# Patient Record
Sex: Female | Born: 1980 | Race: White | Hispanic: No | Marital: Single | Smoking: Never smoker
Health system: Southern US, Community
[De-identification: ages and names within clinical notes are randomized; demographics above are authoritative.]

## PROBLEM LIST (undated history)

## (undated) DIAGNOSIS — F329 Major depressive disorder, single episode, unspecified: Secondary | ICD-10-CM

## (undated) DIAGNOSIS — R5382 Chronic fatigue, unspecified: Secondary | ICD-10-CM

## (undated) DIAGNOSIS — F32A Depression, unspecified: Secondary | ICD-10-CM

## (undated) DIAGNOSIS — G9332 Myalgic encephalomyelitis/chronic fatigue syndrome: Secondary | ICD-10-CM

## (undated) DIAGNOSIS — G4710 Hypersomnia, unspecified: Principal | ICD-10-CM

## (undated) DIAGNOSIS — F419 Anxiety disorder, unspecified: Secondary | ICD-10-CM

## (undated) HISTORY — DX: Depression, unspecified: F32.A

## (undated) HISTORY — DX: Major depressive disorder, single episode, unspecified: F32.9

## (undated) HISTORY — DX: Hypersomnia, unspecified: G47.10

## (undated) HISTORY — DX: Anxiety disorder, unspecified: F41.9

## (undated) HISTORY — DX: Chronic fatigue, unspecified: R53.82

## (undated) HISTORY — DX: Myalgic encephalomyelitis/chronic fatigue syndrome: G93.32

---
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
## 2011-09-01 ENCOUNTER — Ambulatory Visit (INDEPENDENT_AMBULATORY_CARE_PROVIDER_SITE_OTHER): Payer: BC Managed Care – PPO | Admitting: Emergency Medicine

## 2011-09-01 ENCOUNTER — Encounter: Payer: Self-pay | Admitting: Emergency Medicine

## 2011-09-01 VITALS — BP 103/69 | HR 62 | Temp 97.1°F | Resp 16 | Ht 63.0 in | Wt 119.0 lb

## 2011-09-01 DIAGNOSIS — R5381 Other malaise: Secondary | ICD-10-CM

## 2011-09-01 DIAGNOSIS — G4710 Hypersomnia, unspecified: Secondary | ICD-10-CM

## 2011-09-01 DIAGNOSIS — R5383 Other fatigue: Secondary | ICD-10-CM

## 2011-09-01 DIAGNOSIS — G478 Other sleep disorders: Secondary | ICD-10-CM

## 2011-09-01 DIAGNOSIS — Z0000 Encounter for general adult medical examination without abnormal findings: Secondary | ICD-10-CM

## 2011-09-01 DIAGNOSIS — B373 Candidiasis of vulva and vagina: Secondary | ICD-10-CM

## 2011-09-01 LAB — FERRITIN: Ferritin: 47 ng/mL (ref 10–291)

## 2011-09-01 LAB — COMPREHENSIVE METABOLIC PANEL
AST: 16 U/L (ref 0–37)
Albumin: 4.6 g/dL (ref 3.5–5.2)
BUN: 16 mg/dL (ref 6–23)
CO2: 24 mEq/L (ref 19–32)
Calcium: 9.2 mg/dL (ref 8.4–10.5)
Chloride: 106 mEq/L (ref 96–112)
Creat: 0.71 mg/dL (ref 0.50–1.10)
Glucose, Bld: 80 mg/dL (ref 70–99)
Potassium: 4.1 mEq/L (ref 3.5–5.3)

## 2011-09-01 LAB — T4, FREE: Free T4: 1.41 ng/dL (ref 0.80–1.80)

## 2011-09-01 LAB — CBC WITH DIFFERENTIAL/PLATELET
Eosinophils Relative: 3 % (ref 0–5)
HCT: 40.7 % (ref 36.0–46.0)
Hemoglobin: 14.1 g/dL (ref 12.0–15.0)
Lymphocytes Relative: 34 % (ref 12–46)
Lymphs Abs: 1.5 10*3/uL (ref 0.7–4.0)
MCV: 92.1 fL (ref 78.0–100.0)
Monocytes Absolute: 0.3 10*3/uL (ref 0.1–1.0)
Monocytes Relative: 8 % (ref 3–12)
Neutro Abs: 2.5 10*3/uL (ref 1.7–7.7)
RBC: 4.42 MIL/uL (ref 3.87–5.11)
RDW: 12.6 % (ref 11.5–15.5)
WBC: 4.5 10*3/uL (ref 4.0–10.5)

## 2011-09-01 LAB — HIV ANTIBODY (ROUTINE TESTING W REFLEX): HIV: NONREACTIVE

## 2011-09-01 LAB — IBC PANEL: TIBC: 284 ug/dL (ref 250–470)

## 2011-09-01 LAB — TSH: TSH: 3.55 u[IU]/mL (ref 0.350–4.500)

## 2011-09-01 NOTE — Progress Notes (Signed)
  Subjective:    Patient ID: Kristi Mcdowell, female    DOB: 05-30-1980, 31 y.o.   MRN: [id number]  HPI    Review of Systems     Objective:   Physical Exam        Assessment & Plan:

## 2011-09-01 NOTE — Progress Notes (Signed)
@[facility] @  Patient ID: GENEVEIVE FURNESS MRN: [id number], DOB: 1981/01/17, 31 y.o. Date of Encounter: 09/01/2011, 11:22 AM  Primary Physician: No primary provider on file.  Chief Complaint: Physical (CPE)  HPI: 31 y.o. y/o female with history of noted below here for CPE.  Doing well. No issues/complaints.  LMP:  Pap: MMG: Review of Systems:  Consitutional: No fever, chills, she does complain of daily fatigue  night sweats, lymphadenopathy, or weight changes. Eyes: No visual changes, eye redness, or discharge. ENT/Mouth: Ears: No otalgia, tinnitus, hearing loss, discharge. Nose: No congestion, rhinorrhea, sinus pain, or epistaxis. Throat: No sore throat, post nasal drip, or teeth pain. Cardiovascular: No CP, palpitations, diaphoresis, DOE, edema, orthopnea, PND. Respiratory: She has a history of exercise-induced asthma she has Dulera to take but rarely takes it. As a pro-air inhaler to use as GU she has regular menstrual periods with no problem she does have recurrent yeast infection Gastrointestinal: No anorexia, dysphagia, reflux, pain, nausea, vomiting, hematemesis, diarrhea, constipation, BRBPR, or melena. Breast: No discharge, pain, swelling, or mass. Genitourinary: No dysuria, frequency, urgency, hematuria, incontinence, nocturia, amenorrhea, vaginal discharge, pruritis, burning, abnormal bleeding, or pain. Musculoskeletal: No decreased ROM, myalgias, stiffness, joint swelling, or weakness. Skin: No rash, erythema, lesion changes, pain, warmth, jaundice, or pruritis. Neurological: No headache, dizziness, syncope, seizures, tremors, memory loss, coordination problems, or paresthesias. Psychological: No anxiety, depression, hallucinations, SI/HI. Endocrine: No fatigue, polydipsia, polyphagia, polyuria, or known diabetes. All other systems were reviewed and are otherwise negative.  History reviewed. No pertinent past medical history.   History reviewed. No pertinent past surgical  history.  Home Meds:  Prior to Admission medications   Not on File    Allergies: Not on File  History   Social History  . Marital Status: Unknown    Spouse Name: N/A    Number of Children: N/A  . Years of Education: N/A   Occupational History  . Not on file.   Social History Main Topics  . Smoking status: Never Smoker   . Smokeless tobacco: Never Used  . Alcohol Use: Not on file  . Drug Use: Not on file  . Sexually Active: Not Currently   Other Topics Concern  . Not on file   Social History Narrative  . No narrative on file    Family History  Problem Relation Age of Onset  . Cancer Father     Physical Exam  Blood pressure 103/69, pulse 62, temperature 97.1 F (36.2 C), temperature source Oral, resp. rate 16, height 5\' 3"  (1.6 m), weight 119 lb (53.978 kg), last menstrual period 08/04/2011., Body mass index is 21.08 kg/(m^2). General: Well developed, well nourished, in no acute distress. HEENT: Normocephalic, atraumatic. Conjunctiva pink, sclera non-icteric. Pupils 2 mm constricting to 1 mm, round, regular, and equally reactive to light and accomodation. EOMI. Internal auditory canal clear. TMs with good cone of light and without pathology. Nasal mucosa pink. Nares are without discharge. No sinus tenderness. Oral mucosa pink.   Pharynx without exudate.   Neck: Supple. Trachea midline. No thyromegaly. Full ROM. No lymphadenopathy. Lungs: Clear to auscultation bilaterally without wheezes, rales, or rhonchi. Breathing is of normal effort and unlabored. Cardiovascular: RRR with S1 S2. No murmurs, rubs, or gallops appreciated. Distal pulses 2+ symmetrically. No carotid or abdominal bruits  Breast: Not examined  Abdomen: Soft, non-tender, non-distended with normoactive bowel sounds. No hepatosplenomegaly or masses. No rebound/guarding. No CVA tenderness. Without hernias.  Genitourinary: Not examined  Skin there is a 3 x 4 mm  raised and Nevis left flank area without any  surrounding redness or pigment  Musculoskeletal: Full range of motion and 5/5 strength throughout. Without swelling, atrophy, tenderness, crepitus, or warmth. Extremities without clubbing, cyanosis, or edema. Calves supple. Skin: Warm and moist without erythema, ecchymosis, wounds, or rash. Neuro: A+Ox3. CN II-XII grossly intact. Moves all extremities spontaneously. Full sensation throughout. Normal gait. DTR 2+ throughout upper and lower extremities. Finger to nose intact. Psych:  Responds to questions appropriately with a normal affect.    EKG Normal    Assessment/Plan:  31 y.o. y/o female here for CPE her biggest complaints today are of fatigue and daytime somnolence. She has a very irregular sleep pattern. She seems fatigued all the time and sleeps at least 10-12 hours a day. She states she could go to sleep anytime during the day. The symptoms have been going on for a number of years. She's also concerned that she seems to get ill easily. She also is bothered with recurrent yeast infections she treats with probiotics.   -  Signed, Earl Lites, MD 09/01/2011 11:22 AM

## 2011-09-02 LAB — EPSTEIN-BARR VIRUS VCA ANTIBODY PANEL: EBV VCA IgG: 481 U/mL — ABNORMAL HIGH (ref <18.0)

## 2011-09-02 LAB — IGG, IGA, IGM
IgA: 210 mg/dL (ref 69–380)
IgM, Serum: 127 mg/dL (ref 52–322)

## 2011-09-03 ENCOUNTER — Other Ambulatory Visit: Payer: Self-pay | Admitting: Emergency Medicine

## 2011-09-03 DIAGNOSIS — B2790 Infectious mononucleosis, unspecified without complication: Secondary | ICD-10-CM

## 2011-09-09 ENCOUNTER — Telehealth: Payer: Self-pay

## 2011-09-09 NOTE — Telephone Encounter (Signed)
Pt CB to get lab results. Gave her results and instructions from Dr Cleta Alberts. Pt stated she had gotten a copy of labs and had missed  a call from [facility]. Pt agreed to call them back and set up appt for [place].

## 2011-09-16 ENCOUNTER — Other Ambulatory Visit: Payer: Self-pay

## 2011-09-19 ENCOUNTER — Ambulatory Visit
Admission: RE | Admit: 2011-09-19 | Discharge: 2011-09-19 | Disposition: A | Discharge status: Home / Self Care | Payer: BC Managed Care – PPO | Source: Ambulatory Visit | Attending: Emergency Medicine | Admitting: Emergency Medicine

## 2011-09-19 DIAGNOSIS — B2790 Infectious mononucleosis, unspecified without complication: Secondary | ICD-10-CM

## 2011-09-20 ENCOUNTER — Encounter: Payer: Self-pay | Admitting: Radiology

## 2011-09-20 ENCOUNTER — Other Ambulatory Visit: Payer: Self-pay | Admitting: Emergency Medicine

## 2011-09-20 DIAGNOSIS — R161 Splenomegaly, not elsewhere classified: Secondary | ICD-10-CM

## 2011-09-26 ENCOUNTER — Telehealth: Payer: Self-pay | Admitting: Hematology & Oncology

## 2011-09-26 NOTE — Telephone Encounter (Signed)
Left pt message to call and schedule appointment °

## 2011-09-29 ENCOUNTER — Telehealth: Payer: Self-pay | Admitting: Hematology & Oncology

## 2011-09-29 NOTE — Telephone Encounter (Signed)
Pt aware of 10-13-11 appointment

## 2011-09-29 NOTE — Telephone Encounter (Signed)
Left pt message to call for appointment °

## 2011-10-13 ENCOUNTER — Ambulatory Visit (HOSPITAL_BASED_OUTPATIENT_CLINIC_OR_DEPARTMENT_OTHER): Payer: BC Managed Care – PPO | Admitting: Hematology & Oncology

## 2011-10-13 ENCOUNTER — Ambulatory Visit: Payer: BC Managed Care – PPO

## 2011-10-13 ENCOUNTER — Other Ambulatory Visit (HOSPITAL_COMMUNITY)
Admission: RE | Admit: 2011-10-13 | Discharge: 2011-10-13 | Disposition: A | Discharge status: Home / Self Care | Payer: BC Managed Care – PPO | Source: Ambulatory Visit | Attending: Hematology & Oncology | Admitting: Hematology & Oncology

## 2011-10-13 ENCOUNTER — Other Ambulatory Visit (HOSPITAL_BASED_OUTPATIENT_CLINIC_OR_DEPARTMENT_OTHER): Payer: BC Managed Care – PPO | Admitting: Lab

## 2011-10-13 VITALS — BP 107/71 | HR 80 | Temp 97.1°F | Ht 63.0 in | Wt 120.0 lb

## 2011-10-13 DIAGNOSIS — R161 Splenomegaly, not elsewhere classified: Secondary | ICD-10-CM

## 2011-10-13 LAB — CBC WITH DIFFERENTIAL (CANCER CENTER ONLY)
BASO#: 0 10*3/uL (ref 0.0–0.2)
Eosinophils Absolute: 0.1 10*3/uL (ref 0.0–0.5)
HGB: 14.5 g/dL (ref 11.6–15.9)
LYMPH%: 28.7 % (ref 14.0–48.0)
MCH: 32.2 pg (ref 26.0–34.0)
MCV: 92 fL (ref 81–101)
MONO%: 9.1 % (ref 0.0–13.0)
NEUT#: 3.6 10*3/uL (ref 1.5–6.5)
RBC: 4.51 10*6/uL (ref 3.70–5.32)

## 2011-10-13 LAB — LACTATE DEHYDROGENASE: LDH: 135 U/L (ref 94–250)

## 2011-10-13 LAB — TECHNOLOGIST REVIEW CHCC SATELLITE

## 2011-10-13 NOTE — Progress Notes (Signed)
This office note has been dictated.

## 2011-10-14 NOTE — Progress Notes (Signed)
CC:   Stan Head. Cleta Alberts, M.D.  DIAGNOSES: 1. Splenomegaly. 2. Elevated EBV titers.  HISTORY OF PRESENT ILLNESS:  Ms. Kristi Mcdowell is a very nice, 31 year old, white female.  She is followed by Dr. Earl Lites.  He actually delivered her when she was born.  Ms. Groom has been feeling tired for quite a while.  She just does not have any energy.  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  She has been getting through this with some struggle.  Dr. Cleta Alberts did some lab work back in June.  He found that she had a very high titer Malachi Carl virus.  Her Malachi Carl BCA was 481.  She was negative for IgM EBV. She also had a markedly elevated EBV NA IgG titer over 600.  She has been complaining of recurrent sore throat.  She has not had any hoarseness.  She has had recurrent yeast infections.  She sees a gynecologist who has been helping out with this.  She did have immunoglobulin studies done.  Her IgG IgA and IgM were all normal.  She had iron and HIV which was also nonreactive.  She had iron studies done and thyroid, which all seemed to be within normal limits. Sedimentation rate was only 1.  She had lab work with a CBC done on 06/17.  This showed a white cell count of 4.5, hemoglobin 14.1, hematocrit 40.7, platelet count 187.  She had normal white cell differential.  She had a normal metabolic panel. An ultrasound was done of the abdomen.  This showed some splenomegaly, with a spleen that was slightly prominent at 11.6 cm.  Liver was also "slightly prominent" measuring 17.3 cm.  No lymphadenopathy was appreciated.  Kidneys looked unremarkable.  She has had no fever.  Her weight has been doing okay.  She is trying to exercise but does feel tired.  She does not notice any rashes.  She has had no mouth sores.  There has been no nausea or vomiting.  She has had __________ obviously swollen lymph glands.  She says that her throat gets sore on occasion.  PAST MEDICAL HISTORY:   Unremarkable.  ALLERGIES:  None.  MEDICATIONS:  Multivitamin daily.  SOCIAL HISTORY:  Negative for tobacco use.  There is no significant alcohol use.  No exposure to recreational drugs.  FAMILY HISTORY:  Remarkable for a father with prostate cancer.  He is still alive.  There may be history of hypertension in the family.  REVIEW OF SYSTEMS:  As stated in history of present illness.  No additional findings are noted on a 12-system review.  PHYSICAL EXAMINATION:  General:  This is a well-developed, well- nourished, white female in no obvious distress.  Vital signs:  Show a temperature of 97.1, pulse 80, respiratory rate 18, blood pressure 107/71.  Weight is 120.  Head and neck:  Normocephalic, atraumatic skull.  There are no ocular or oral lesions.  Tonsils are not enlarged. Her neck is supple without any lymphadenopathy in the cervical or supraclavicular regions.  There may be some slight tenderness to palpation over her throat.  Her lungs are clear bilaterally.  Cardiac: Regular rate and rhythm with a normal S1 and S2.  There are no murmurs, rubs or bruits.  Axillary exam shows no bilateral axillary adenopathy. Abdomen:  Soft with good bowel sounds.  There is no palpable abdominal mass.  There is no fluid wave.  There is no guarding or rebound tenderness.  There is no palpable  hepatomegaly.  I really cannot even feel her spleen tip.  I put her on to her right side.  I still cannot feel her spleen tip with inspiration.  Back:  No tenderness over the spine, ribs, or hips.  Extremities:  Shows no clubbing, cyanosis or edema.  She has good range motion of her joints.  There is no joint erythema, warmth or swelling.  Skin:  No rashes, ecchymosis or petechia. Neurologic:  No focal neurological deficits.  LABORATORY STUDIES:  White cell count is 6, hemoglobin 14.5, hematocrit 41.5, platelet count 182.  Peripheral smear shows a normochromic, normocytic population of red blood cells.   There are no nucleated red blood cells.  There are no teardrop cells.  I see no rouleaux formation. There is no spherocytes or schistocyte.  White cells appear normal in morphology and maturation.  There may be a few atypical lymphocytes.  I do not see any immature myeloid or lymphoid cells.  There is no hypersegmented polys.  There are no blasts.  Platelets are adequate in number and size.  IMPRESSION:  Ms. Tabbert is a very nice, 31 year old, white female with elevated EBV titers.  These reflect past infection. I really cannot feel the spleen.  Her ultrasound was done 3 weeks ago. It is possible that the splenomegaly is regressing.  I do not see anything that would suggest an underlying lymphoproliferative issue.  I realize that EBV can be the etiology for Hodgkin disease, Burkitt's lymphoma and nasopharyngeal carcinoma. Again, I do not see any evidence that would suggest any of these issues.  I did send off her peripheral blood for flow cytometry.  I think this would be the ultimate test to let us see if there is any type of monoclonality with lymphocytes.  If so, then it is possible that we may need to further investigate Ms. Fuller with scans or possibly even a bone marrow test.  I would be shocked if Ms. Giancola does have anything that would be considered malignant.  Again, I am not sure what these elevated EBV titers really indicated.  It is possible that she may need to go see an infectious disease specialist for an evaluation.  I felt somewhat comforted knowing that on her exam I did not detect an enlarged liver or spleen.  I cannot detect any palpable lymph nodes in the peripheral lymph node regions.  I had a real nice time with Ms. Broecker.  She is very eloquent.  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  She has a good personality for that type of work.  I really do not think we have to get Ms.  Schuneman back to see [place].  Again, we will let her know about the results from her flow cytometry.  I just do not see any underlying lymphoproliferative problem that Ms. Lien has at this point in time.  I suppose she always may develop something down the road but for now, I just think that this elevated EBV may be related to chronic fatigue syndrome, if that might be an issue. I spent a good hour with Ms. Mellen today.    ______________________________ Josph Macho, M.D. PRE/MEDQ  D:  10/13/2011  T:  10/14/2011  Job:  [id number]

## 2011-10-15 ENCOUNTER — Telehealth: Payer: Self-pay | Admitting: *Deleted

## 2011-10-15 NOTE — Telephone Encounter (Addendum)
Message copied by Mirian Capuchin on Wed Oct 15, 2011  4:39 PM ------      Message from: Josph Macho      Created: Tue Oct 14, 2011  9:09 PM       Call - the very special and specific blood test dose NOT show any evidence of leukemia or lymphoma.  NO cancer cells are seen in the blood!!!! Please send this result to Dr. Earl Lites (her primary MD). Thanks!!  Pete Left message on pt's own cell phone for her to call us about her very good lab results.

## 2011-11-16 ENCOUNTER — Ambulatory Visit (INDEPENDENT_AMBULATORY_CARE_PROVIDER_SITE_OTHER): Payer: BC Managed Care – PPO | Admitting: Emergency Medicine

## 2011-11-16 VITALS — BP 99/64 | HR 76 | Temp 98.5°F | Resp 16 | Ht 63.5 in | Wt 121.0 lb

## 2011-11-16 DIAGNOSIS — A184 Tuberculosis of skin and subcutaneous tissue: Secondary | ICD-10-CM

## 2011-11-16 DIAGNOSIS — Z23 Encounter for immunization: Secondary | ICD-10-CM

## 2011-11-16 DIAGNOSIS — R161 Splenomegaly, not elsewhere classified: Secondary | ICD-10-CM

## 2011-11-16 DIAGNOSIS — R5381 Other malaise: Secondary | ICD-10-CM

## 2011-11-16 NOTE — Progress Notes (Signed)
  Subjective:    Patient ID: Kristi Mcdowell, female    DOB: 03-18-1980, 31 y.o.   MRN: [id number]  HPI patient enters to have form completed for school . She has a history of splenomegaly. She's been to see Dr. Myna Hidalgo. He did not feel she had an underlying blood dyscrasia. 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 She still suffers from fatigue. She has to get extra rest during the day. Tdap was updated today PPD was applied to the right forearm

## 2013-02-12 ENCOUNTER — Telehealth: Payer: Self-pay | Admitting: Emergency Medicine

## 2013-02-12 ENCOUNTER — Other Ambulatory Visit: Payer: Self-pay | Admitting: Emergency Medicine

## 2013-02-12 DIAGNOSIS — F411 Generalized anxiety disorder: Secondary | ICD-10-CM

## 2013-02-12 MED ORDER — ALPRAZOLAM 0.25 MG PO TABS: ORAL_TABLET | ORAL | Status: DC

## 2013-02-12 NOTE — Telephone Encounter (Signed)
I received a phone call from Dr. Darrick Penna. Kristi Mcdowell has been seeing a therapist but recent episodes of anxiety with panic symptoms. We'll treat with alprazolam 0.25 one half to one for episodes. She is going to come to see me in the next few weeks to discuss what is going on and to consider possible SSRI therapy

## 2013-03-12 ENCOUNTER — Telehealth: Payer: Self-pay

## 2013-03-12 NOTE — Telephone Encounter (Signed)
[facility] - PT REQUESTS REFILL ON ALPRAZOLAM.  [phone]

## 2013-03-12 NOTE — Telephone Encounter (Signed)
Called in rx for pt. LMOM of pt that we called it in for her.

## 2013-03-17 ENCOUNTER — Other Ambulatory Visit: Payer: Self-pay | Admitting: Emergency Medicine

## 2013-03-21 ENCOUNTER — Ambulatory Visit (INDEPENDENT_AMBULATORY_CARE_PROVIDER_SITE_OTHER): Payer: BC Managed Care – PPO | Admitting: Emergency Medicine

## 2013-03-21 ENCOUNTER — Encounter: Payer: Self-pay | Admitting: Emergency Medicine

## 2013-03-21 VITALS — BP 106/60 | HR 68 | Temp 98.1°F | Resp 16 | Ht 63.0 in | Wt 121.4 lb

## 2013-03-21 DIAGNOSIS — R5383 Other fatigue: Principal | ICD-10-CM

## 2013-03-21 DIAGNOSIS — R400 Somnolence: Secondary | ICD-10-CM

## 2013-03-21 DIAGNOSIS — R161 Splenomegaly, not elsewhere classified: Secondary | ICD-10-CM

## 2013-03-21 DIAGNOSIS — G4710 Hypersomnia, unspecified: Secondary | ICD-10-CM

## 2013-03-21 DIAGNOSIS — F411 Generalized anxiety disorder: Secondary | ICD-10-CM

## 2013-03-21 DIAGNOSIS — R51 Headache: Secondary | ICD-10-CM

## 2013-03-21 DIAGNOSIS — R5381 Other malaise: Secondary | ICD-10-CM | POA: Insufficient documentation

## 2013-03-21 LAB — COMPLETE METABOLIC PANEL WITH GFR
AST: 17 U/L (ref 0–37)
Albumin: 4.6 g/dL (ref 3.5–5.2)
Alkaline Phosphatase: 39 U/L (ref 39–117)
BUN: 11 mg/dL (ref 6–23)
CALCIUM: 9.3 mg/dL (ref 8.4–10.5)
CHLORIDE: 103 meq/L (ref 96–112)
CO2: 24 meq/L (ref 19–32)
CREATININE: 0.67 mg/dL (ref 0.50–1.10)
GFR, Est Non African American: >89 mL/min
Glucose, Bld: 85 mg/dL (ref 70–99)
Potassium: 4 mEq/L (ref 3.5–5.3)
Sodium: 138 mEq/L (ref 135–145)
Total Bilirubin: 0.9 mg/dL (ref 0.3–1.2)
Total Protein: 7.1 g/dL (ref 6.0–8.3)

## 2013-03-21 LAB — POCT URINALYSIS DIPSTICK
Bilirubin, UA: NEGATIVE
GLUCOSE UA: NEGATIVE
KETONES UA: NEGATIVE
Leukocytes, UA: NEGATIVE
Nitrite, UA: NEGATIVE
Protein, UA: NEGATIVE
Urobilinogen, UA: 0.2
pH, UA: 6

## 2013-03-21 LAB — POCT UA - MICROSCOPIC ONLY
BACTERIA, U MICROSCOPIC: NEGATIVE
CRYSTALS, UR, HPF, POC: NEGATIVE
Casts, Ur, LPF, POC: NEGATIVE
Mucus, UA: NEGATIVE
Yeast, UA: POSITIVE

## 2013-03-21 LAB — CBC WITH DIFFERENTIAL/PLATELET
Basophils Absolute: 0 10*3/uL (ref 0.0–0.1)
Basophils Relative: 0 % (ref 0–1)
Eosinophils Absolute: 0.2 10*3/uL (ref 0.0–0.7)
Eosinophils Relative: 4 % (ref 0–5)
HEMATOCRIT: 40 % (ref 36.0–46.0)
Hemoglobin: 14.2 g/dL (ref 12.0–15.0)
LYMPHS ABS: 1.4 10*3/uL (ref 0.7–4.0)
Lymphocytes Relative: 32 % (ref 12–46)
MCH: 31.9 pg (ref 26.0–34.0)
MCHC: 35.5 g/dL (ref 30.0–36.0)
MCV: 89.9 fL (ref 78.0–100.0)
MONO ABS: 0.4 10*3/uL (ref 0.1–1.0)
Monocytes Relative: 9 % (ref 3–12)
Neutro Abs: 2.3 10*3/uL (ref 1.7–7.7)
Neutrophils Relative %: 55 % (ref 43–77)
Platelets: 185 10*3/uL (ref 150–400)
RBC: 4.45 MIL/uL (ref 3.87–5.11)
RDW: 12.6 % (ref 11.5–15.5)
WBC: 4.2 10*3/uL (ref 4.0–10.5)

## 2013-03-21 LAB — TSH: TSH: 3.046 u[IU]/mL (ref 0.350–4.500)

## 2013-03-21 LAB — SEDIMENTATION RATE: Sed Rate: 1 mm/hr (ref 0–22)

## 2013-03-21 LAB — T4, FREE: FREE T4: 1.32 ng/dL (ref 0.80–1.80)

## 2013-03-21 LAB — C-REACTIVE PROTEIN: CRP: <0.5 mg/dL (ref <0.60)

## 2013-03-21 MED ORDER — ALPRAZOLAM 0.25 MG PO TABS: ORAL_TABLET | ORAL | Status: DC

## 2013-03-21 NOTE — Progress Notes (Signed)
   Subjective:    Patient ID: Kristi Mcdowell, female    DOB: 01/14/1981, 33 y.o.   MRN: [id number]  Headache  This is a chronic problem. The current episode started more than 1 year ago. The problem occurs intermittently. The problem has been unchanged. The pain is located in the bilateral region. The pain quality is similar to prior headaches. The quality of the pain is described as dull. The pain is mild.   Fatigue is persistent and she would sleep all day.she has anxiety and constantly worries about her job.She does see a therapist regularly   Review of Systems  Neurological: Positive for headaches.       Objective:   Physical Exam  Constitutional: She is oriented to person, place, and time. She appears well-developed and well-nourished.  HENT:  Head: Normocephalic and atraumatic.  Right Ear: External ear normal.  Left Ear: External ear normal.  Eyes: Pupils are equal, round, and reactive to light. Right eye exhibits no discharge. Left eye exhibits no discharge. No scleral icterus.  Neck: Normal range of motion. Neck supple. No JVD present. No tracheal deviation present. No thyromegaly present.  Bean sized right post cervical node  Cardiovascular: Normal rate and regular rhythm.   Pulmonary/Chest: Effort normal and breath sounds normal. No respiratory distress. She has no wheezes.  Abdominal: She exhibits no distension. There is no tenderness. There is no rebound and no guarding.  Musculoskeletal: Normal range of motion.  Lymphadenopathy:    She has no cervical adenopathy.  Neurological: She is alert and oriented to person, place, and time. She has normal reflexes. She displays normal reflexes. No cranial nerve deficit. She exhibits normal muscle tone. Coordination normal.  Psychiatric: Her behavior is normal. Judgment and thought content normal.  She does seem depressed          Assessment & Plan:

## 2013-03-21 NOTE — Patient Instructions (Signed)
I repeated your labs today. Appts for  abd. US to check the spleen. Appt Dr Vickey Hugerohmeier for your drowsiness.

## 2013-03-22 ENCOUNTER — Other Ambulatory Visit: Payer: Self-pay | Admitting: *Deleted

## 2013-03-22 DIAGNOSIS — R5381 Other malaise: Secondary | ICD-10-CM

## 2013-03-22 DIAGNOSIS — R5383 Other fatigue: Principal | ICD-10-CM

## 2013-03-22 LAB — EBV AB TO VIRAL CAPSID AG PNL, IGG+IGM: EBV VCA IGG: 607 U/mL — AB (ref <18.0)

## 2013-03-22 NOTE — Addendum Note (Signed)
Addended by: Mervin KungWILLIAMS, RAMONA K on: 03/22/2013 08:58 AM   Modules accepted: Orders

## 2013-03-23 LAB — EPSTEIN-BARR VIRUS NUCLEAR ANTIGEN ANTIBODY, IGG: EBV NA IgG: >600 U/mL — ABNORMAL HIGH (ref <18.0)

## 2013-03-23 LAB — EPSTEIN-BARR VIRUS EARLY D ANTIGEN ANTIBODY, IGG: EBV EA IGG: 11.9 U/mL — AB (ref <9.0)

## 2013-04-04 ENCOUNTER — Other Ambulatory Visit: Payer: BC Managed Care – PPO

## 2013-04-06 ENCOUNTER — Ambulatory Visit
Admission: RE | Admit: 2013-04-06 | Discharge: 2013-04-06 | Disposition: A | Discharge status: Home / Self Care | Payer: BC Managed Care – PPO | Source: Ambulatory Visit | Attending: Emergency Medicine | Admitting: Emergency Medicine

## 2013-04-06 DIAGNOSIS — R161 Splenomegaly, not elsewhere classified: Secondary | ICD-10-CM

## 2013-04-27 ENCOUNTER — Encounter: Payer: Self-pay | Admitting: Neurology

## 2013-04-27 ENCOUNTER — Ambulatory Visit (INDEPENDENT_AMBULATORY_CARE_PROVIDER_SITE_OTHER): Payer: BC Managed Care – PPO | Admitting: Neurology

## 2013-04-27 VITALS — BP 104/72 | HR 74 | Resp 16 | Ht 63.5 in | Wt 121.0 lb

## 2013-04-27 DIAGNOSIS — G47421 Narcolepsy in conditions classified elsewhere with cataplexy: Secondary | ICD-10-CM

## 2013-04-27 DIAGNOSIS — R5382 Chronic fatigue, unspecified: Secondary | ICD-10-CM

## 2013-04-27 DIAGNOSIS — G4710 Hypersomnia, unspecified: Secondary | ICD-10-CM

## 2013-04-27 DIAGNOSIS — G9332 Myalgic encephalomyelitis/chronic fatigue syndrome: Secondary | ICD-10-CM

## 2013-04-27 NOTE — Progress Notes (Signed)
[facility]  Provider:  Larey Seat, [place] D  Referring Provider: Darlyne Russian, MD Primary Care Physician:  Jenny Reichmann, MD  Hypersomnia concern.   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 Everlene Farrier for a sleep evaluation, related to a concern of hypersomnia.   Dear Dr. Everlene Farrier,  Thank you very much for referring your patient Kristi Mcdowell to my care ;   As you know , Kristi Mcdowell is  a 33 year old right-handed, Caucasian female patient, who had been seen recently by you.  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  She has by now been chronically fatigued for over 18 month . The hypersomnia symptom episode begun along  waxing and waning symptoms of malaise and fatigue, the problem has been intermittently occurring but was  Never " gone " .   The patient insists that her co-existent depression did not to correlate with the degree of sleepiness or fatigue she experiences. She has not gained or lost a dramatic amount of weight in the last 3 years at any given time, was not tearful or agitated to a degree that suggests an exacerbation of a mood disorder.    She  reports the following sleep habits;  Kristi Mcdowell attempts to go to bed regularly at about 11 PM, yet it will  take her between 30-60 minutes to fall asleep. She describes her bedroom at cool, quiet and dark and she does not watch TV nor reads when in bed.  Occasionally, she will feel fatigued as early as 8 or 9 PM- she will go earlier to bed and fall asleep promptly, but than  experiences very vivid dreams . She has made a conscious effort to establish a routine bedtime at about 11 PM over the last 2 month. She will now sleep through the night, will not wake up, reports no fragmentation of  sleep. She has to rise  in the morning at about 7:30 and she relies on  multiple alarms at multiple times . She is difficult to arouse, but has been since  childhood  .  She " never" feels that she spontaneously could be ready to get up and go. At this time, she drinks 1-2 cups of coffee in AM , and one in daytime, but none after lunch . Coffee seems to give her the desired little extra kick.  If there is no alarm clock set, she may sleep up to 11 or 12 noon, but rising late, she is not feeling refreshed or restored. She may feel worse and have a headache. She shares the bedroom with her boyfriend.  A dull headache  was noted during the day- increasing as the day goes by.  It doesn't wake her from sleep and doesn't seem to be regularly present as she wakens up in the morning. Her headache is unrelated to any activity and is not exacerbated by activity, is not better when laying down or standing up.She naps whenever there is an opportunity and her naps feel refreshing, even if as brief as  20-30 minutes in duration . She craves napping longer. She has reported frequent  dreams , vivid dreams occurring early in a nap or early onset during night time sleep.  She had experienced sleep  paralysis, dream intrusion and cataplexy.    PS: There is no photophobia, phonophobia, nausea or vomiting related to the HA reported . In addition, the patient reports occasional spells of fevers and elevated temperature, but she has not measured her temperature objectively.  This seems to resolve on its own after several hours,  Sometimes after  has she taken a Tylenol , and she's not sure if Tylenol has any true effect on resolving the symptom of " fever "any quicker.     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  Urine tests performed on 04/27/2013 were negative, CBC performed on 03-21-13 documented  a normal white blood cell count, red blood cell count, hemoglobin and hematocrit, differential blood count did not give any evidence of a acute infection of  bacterial, fungal, allergic or viral factors.  The urine analysis was again in  normal range. Her complete metabolic profile was entirely normal,  as well TSH, T4 - her sedimentation rate is normal , her C-reactive protein is low.  Her repeat viral capsule antigen testing was positive for IgG and IgM, levels  were rising  - reflected in the EBV Epstein-Barr virus VCA Ig units over the last year from 481 to 607.    Ultrasound from 7-5 2013 and the follow up clinic visit in August with Dr. Everlene Farrier revealed that the patient has splenomegaly.    She has no family history of a sleep disorder, works as a Education officer, museum, both parents are physicians , Dr. Aline Brochure and Dr. Oneida Alar.       Review of Systems: Out of a complete 14 system review, the patient complains of only the following symptoms, and all other reviewed systems are negative.  High fatigue level at 50 points. Epworth 11 .  The patient reports falling asleep and going into dream sleep very quickly , often vivid dreams she can recall. She reports that after being startled or emotionally upset she will experience brief periods of muscle tone loss, she has had several episodes of sleep paralysis. She denies having any daytime attacks, she naps when she can , but not having  the irresistible urge to fall asleep.   Her fatigue severity score was endorsed at 50 points and her Epworth sleepiness score is endorsed at 11 points . In her review of systems she endorsed decreased energy and the feeling that she actually sleeps too much  in relation to the sleep time . Sleepiness level  is neither affected by duration of sleep , and described as  not  restorative. She also has endorsed fevers and headaches and some submandibular lymph node swelling.  History   Social History  . Marital Status: Single    Spouse Name: N/A    Number of Children: N/A  . Years of Education: N/A   Occupational History  . Social Work    Social History Main Topics  . Smoking status: Never Smoker   . Smokeless tobacco: Never Used  . Alcohol Use: Yes     Comment: 3 drinks  . Drug Use: No  . Sexual Activity: Yes   Other Topics Concern  . Not on file   Social History Narrative   Significant  other. Exercise: Yes.    Family History  Problem Relation Age of Onset  . Cancer Father   . Heart disease Maternal Grandfather   . Hyperlipidemia Maternal Grandfather   . Hypertension Maternal Grandfather   . Stroke Maternal Grandfather     Past Medical History  Diagnosis Date  . Anxiety   . Depression     No past surgical history on file.  Current Outpatient Prescriptions  Medication Sig Dispense Refill  . ALPRAZolam (XANAX) 0.25 MG tablet Take 1/2-1 tablet as needed for panic episodes  20 tablet  1  . Multiple Vitamin (MULTI-VITAMIN DAILY PO) Take by mouth every morning.      . NON FORMULARY every morning. Herbal mixture tab.      Marland Kitchen OVER THE COUNTER MEDICATION Vitamins D 3, C, B 9      . OVER THE COUNTER MEDICATION Fish oil taking occas       No current facility-administered medications for this visit.    Allergies as of 04/27/2013  . (No Known Allergies)    Vitals: There were no vitals taken for this visit. Last Weight:  Wt Readings from Last 1 Encounters:  03/21/13 121 lb 6.4 oz (55.067 kg)   Last Height:   Ht Readings from Last 1 Encounters:  03/21/13 5' 3"  (1.6 m)    Physical exam: Physical Exam  Head: Normocephalic and atraumatic.  Right Ear: External ear normal.  Left Ear: External ear normal.  Eyes: Pupils are equal, round, and reactive to light. Right eye exhibits no discharge. Left eye exhibits no discharge. No  scleral icterus.  Neck: Normal range of motion. Neck supple. No JVD present. No tracheal deviation present. No thyromegaly present.  Bean sized right posterior cervical lymph node   General: The patient is awake, alert and appears not in acute distress. The patient is well groomed. Head: Normocephalic, atraumatic. Neck is supple. Mallampati 1 , neck circumference: 14  Cardiovascular:  Regular rate and rhythm , without  murmurs or carotid bruit, and without distended neck veins. Respiratory: Lungs are clear to auscultation. Skin:  Without evidence of edema, or rash Trunk: BMI is normal .  The patient has normal posture.  Neurologic exam : The patient is awake and alert, oriented to place and time.  Memory subjective described as intact.  There is a normal attention span & concentration ability. Speech is fluent without  dysarthria, dysphonia or aphasia. Mood and affect are appropriate. The patient was neither depressed nor elated .   Cranial nerves: Pupils are equal and briskly reactive to light. Funduscopic exam without  evidence of pallor or edema.  Extraocular movements  in vertical and horizontal planes intact and without nystagmus. Visual Richman by finger perimetry are intact. Hearing to finger rub intact.  Facial sensation intact to fine touch. Facial motor strength is symmetric and tongue and uvula move midline.  Motor exam:   Normal tone, muscle bulk and symmetric strength in all extremities.  Sensory:  Fine touch, pinprick and vibration were tested in all extremities. Proprioception was normal.  Coordination: Rapid alternating movements in the fingers/hands is normal. Finger-to-nose maneuvernormal without evidence of ataxia, dysmetria or tremor.  Gait and station: Patient walks without assistive device , un- assisted able to  climb up to the exam table.  Strength within normal limits. Stance is stable and normal. Tandem gait is intact  , turns with 3 Steps are un fragmented.  Romberg testing is normal.  Deep tendon reflexes: in the  upper and  lower extremities are symmetric and intact. Babinski maneuver response is  down going.   Assessment:  After physical and neurologic examination, review of laboratory studies, imaging, neurophysiology testing and pre-existing records, assessment is :  #1 the patient's appears to be chronically fatigued. Pulse she has a history of depression but  feels that changes in her mood have not correlated with changes in her sleep pattern.  She is chronically hypersomnic with prolonged sleep time . This has been progressive  over the last 15 months and seems to correlate with an increase in Epstein barr virus capsule antigen.  She still has to work on her sleep hygiene and establish better sleep routine, but she has been well on the way to at least establish a bedtime and a rise time.  She is also asked to further  restricted her caffeine intake.  Her report of sleep of  Power naps feeling refreshing and of dreams that will occur even during  short naps causes me to evlaluate her of narcolepsy.   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   I suggested to obtain a Hypocretin/ Orexin CSF level , which will be reflecting an acquired form of narcolepsy.  In some cases ,  narcolepsy can manifest as chronic fatigue without necessarily having the hallmark sleep attacks being present.  As noted above, the patient has hypersomnia with extended, prolonged sleep times.  There is also an HLA test we can order - after discussion with the patient, she favored this approach over the procedure of the lumbal puncture.   Plan:  Treatment plan and additional workup :  PSG and MSLT.  HLA test for genetic disposition to narcolepsy  . If negative :  CSF orexin / hypocretin.    Payeton Germani, MD

## 2013-04-27 NOTE — Patient Instructions (Signed)
Hypersomnia Hypersomnia usually brings recurrent episodes of excessive daytime sleepiness or prolonged nighttime sleep. It is different than feeling tired due to lack of or interrupted sleep at night. People with hypersomnia are compelled to nap repeatedly during the day. This is often at inappropriate times such as:  At work.  During a meal.  In conversation. These daytime naps usually provide no relief. This disorder typically affects adolescents and young adults. CAUSES  This condition may be caused by:  Another sleep disorder (such as narcolepsy or sleep apnea).  Dysfunction of the autonomic nervous system.  Drug or alcohol abuse.  A physical problem, such as:  A tumor.  Head trauma. This is damage caused by an accident.  Injury to the central nervous system.  Certain medications, or medicine withdrawal.  Medical conditions may contribute to the disorder, including:  Multiple sclerosis.  Depression.  Encephalitis.  Epilepsy.  Obesity.  Some people appear to have a genetic predisposition to this disorder. In others, there is no known cause. SYMPTOMS   Patients often have difficulty waking from a long sleep. They may feel dazed or confused.  Other symptoms may include:  Anxiety.  Increased irritation (inflammation).  Decreased energy.  Restlessness.  Slow thinking.  Slow speech.  Loss of appetite.  Hallucinations.  Memory difficulty.  Tremors, Tics.  Some patients lose the ability to function in family, social, occupational, or other settings. TREATMENT  Treatment is symptomatic in nature. Stimulants and other drugs may be used to treat this disorder. Changes in behavior may help. For example, avoid night work and social activities that delay bed time. Changes in diet may offer some relief. Patients should avoid alcohol and caffeine. PROGNOSIS  The likely outcome (prognosis) for persons with hypersomnia depends on the cause of the disorder.  The disorder itself is not life threatening. But it can have serious consequences. For example, automobile accidents can be caused by falling asleep while driving. The attacks usually continue indefinitely. Document Released: 02/21/2002 Document Revised: 05/26/2011 Document Reviewed: 01/26/2008 [employer] Patient Information 2014 [employer].  

## 2013-05-05 ENCOUNTER — Encounter: Payer: Self-pay | Admitting: Neurology

## 2013-05-05 DIAGNOSIS — G9332 Myalgic encephalomyelitis/chronic fatigue syndrome: Secondary | ICD-10-CM

## 2013-05-05 DIAGNOSIS — R5382 Chronic fatigue, unspecified: Secondary | ICD-10-CM

## 2013-05-05 DIAGNOSIS — G4710 Hypersomnia, unspecified: Secondary | ICD-10-CM

## 2013-05-05 HISTORY — DX: Chronic fatigue, unspecified: R53.82

## 2013-05-05 HISTORY — DX: Hypersomnia, unspecified: G47.10

## 2013-05-05 HISTORY — DX: Myalgic encephalomyelitis/chronic fatigue syndrome: G93.32

## 2013-05-06 ENCOUNTER — Ambulatory Visit (INDEPENDENT_AMBULATORY_CARE_PROVIDER_SITE_OTHER): Payer: BC Managed Care – PPO | Admitting: Emergency Medicine

## 2013-05-06 ENCOUNTER — Encounter: Payer: Self-pay | Admitting: Emergency Medicine

## 2013-05-06 VITALS — BP 108/52 | HR 120 | Temp 98.3°F | Resp 16 | Wt 119.0 lb

## 2013-05-06 DIAGNOSIS — R5383 Other fatigue: Secondary | ICD-10-CM

## 2013-05-06 DIAGNOSIS — R161 Splenomegaly, not elsewhere classified: Secondary | ICD-10-CM

## 2013-05-06 DIAGNOSIS — G4700 Insomnia, unspecified: Secondary | ICD-10-CM

## 2013-05-06 DIAGNOSIS — B2790 Infectious mononucleosis, unspecified without complication: Secondary | ICD-10-CM

## 2013-05-06 DIAGNOSIS — R5381 Other malaise: Secondary | ICD-10-CM

## 2013-05-06 LAB — CBC WITH DIFFERENTIAL/PLATELET
BASOS ABS: 0 10*3/uL (ref 0.0–0.1)
Basophils Relative: 0 % (ref 0–1)
EOS ABS: 0.1 10*3/uL (ref 0.0–0.7)
Eosinophils Relative: 1 % (ref 0–5)
HCT: 44 % (ref 36.0–46.0)
HEMOGLOBIN: 15.5 g/dL — AB (ref 12.0–15.0)
Lymphocytes Relative: 31 % (ref 12–46)
Lymphs Abs: 1.6 10*3/uL (ref 0.7–4.0)
MCH: 31.8 pg (ref 26.0–34.0)
MCHC: 35.2 g/dL (ref 30.0–36.0)
MCV: 90.3 fL (ref 78.0–100.0)
MONO ABS: 0.3 10*3/uL (ref 0.1–1.0)
MONOS PCT: 6 % (ref 3–12)
NEUTROS PCT: 62 % (ref 43–77)
Neutro Abs: 3.2 10*3/uL (ref 1.7–7.7)
Platelets: 200 10*3/uL (ref 150–400)
RBC: 4.87 MIL/uL (ref 3.87–5.11)
RDW: 12.8 % (ref 11.5–15.5)
WBC: 5.1 10*3/uL (ref 4.0–10.5)

## 2013-05-06 NOTE — Progress Notes (Signed)
I agree with the assessment and plan as directed by NP .The patient is known to me .   Rendy Lazard, MD  

## 2013-05-06 NOTE — Progress Notes (Addendum)
   Subjective:    Patient ID: Kristi Mcdowell, female    DOB: 08/12/1980, 33 y.o.   MRN: [id number]  HPI  Visits with Dr. Vickey Hugerohmeier  are going well..  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  Doctor said that it could be virally induced.  Sore throat and getting headaches every other day.  Slept all week.  Forces herself to go to work.  Usually gets a burst of energy in the afternoon.  Thinks the problem has been going on for years.  Thinks it started in her early 20's but getting worse now.  Would like to see someone who specializes in chronic fatigue.  Likes to sleep and dreams the entire time and never feels rested. She states she feels like she has a chronic sore throat and her glands swell off and on. She has rising titers of EBV along with symptoms consistent with narcolepsy .      Review of Systems     Objective:   Physical Exam Patient is alert and cooperative she is not in any distress. Her neck is supple. Her chest is clear. Heart is regular rate without murmurs rubs or gallops. Abdomen soft liver spleen not enlarged there are no areas of tenderness. The spleen could not be palpated. There is no adenopathy in the neck axillary areas or inguinal areas       Assessment & Plan:   patient is proceeding with narcolepsy evaluation with Dr. Vickey Hugerohmeier. She does have some concerns about the mention of MS. I will support a copy of these notes to Dr. Myna HidalgoEnnever and also to Dr. Vickey Hugerohmeier. I did repeat her viral titers today and added Lyme titers and titers for  trypanosomiasis because [place] . Referral also made to Dr.  Jacinto HalimGanji for  echo to be sure she does not have any type of cardiomyopathy

## 2013-05-07 LAB — TOXOPLASMA ANTIBODIES- IGG AND  IGM: Toxoplasma IgG Ratio: <3 IU/mL (ref <7.2)

## 2013-05-07 LAB — EPSTEIN-BARR VIRUS VCA ANTIBODY PANEL
EBV EA IgG: 13.3 U/mL — ABNORMAL HIGH (ref <9.0)
EBV NA IgG: >600 U/mL — ABNORMAL HIGH (ref <18.0)
EBV VCA IGG: 522 U/mL — AB (ref <18.0)
EBV VCA IgM: <10 U/mL (ref <36.0)

## 2013-05-07 LAB — CYTOMEGALOVIRUS ANTIBODY, IGG: Cytomegalovirus Ab-IgG: >10 U/mL — ABNORMAL HIGH (ref <0.60)

## 2013-05-07 NOTE — Addendum Note (Signed)
Addended by: Lesle ChrisAUB, Zamari Vea A on: 05/07/2013 07:37 AM   Modules accepted: Orders

## 2013-05-09 LAB — B. BURGDORFI ANTIBODIES: B BURGDORFERI AB IGG+ IGM: 0.3 {ISR}

## 2013-05-10 ENCOUNTER — Telehealth: Payer: Self-pay | Admitting: Neurology

## 2013-05-10 NOTE — Progress Notes (Signed)
Quick Note:  Left message that lab showed elevated EBvirus and CMV antibodies, per Dr. Brett Fairy. Patient is call and come in for HLA typing blood draw. ______

## 2013-05-10 NOTE — Telephone Encounter (Signed)
Left message that the blood test for HLA typing was negative.  Told to call with any questions.

## 2013-05-13 LAB — TRYPANOSOMA CRUZI ANTIBODY, TOTAL

## 2013-05-14 ENCOUNTER — Other Ambulatory Visit: Payer: Self-pay | Admitting: Emergency Medicine

## 2013-05-14 DIAGNOSIS — B259 Cytomegaloviral disease, unspecified: Secondary | ICD-10-CM

## 2013-05-14 DIAGNOSIS — B2790 Infectious mononucleosis, unspecified without complication: Secondary | ICD-10-CM

## 2013-05-24 ENCOUNTER — Ambulatory Visit (INDEPENDENT_AMBULATORY_CARE_PROVIDER_SITE_OTHER): Payer: BC Managed Care – PPO | Admitting: Neurology

## 2013-05-24 DIAGNOSIS — G47421 Narcolepsy in conditions classified elsewhere with cataplexy: Secondary | ICD-10-CM

## 2013-05-24 DIAGNOSIS — G4710 Hypersomnia, unspecified: Secondary | ICD-10-CM

## 2013-05-25 ENCOUNTER — Ambulatory Visit: Payer: BC Managed Care – PPO | Admitting: Infectious Disease

## 2013-05-25 DIAGNOSIS — G4710 Hypersomnia, unspecified: Secondary | ICD-10-CM

## 2013-05-25 DIAGNOSIS — G47421 Narcolepsy in conditions classified elsewhere with cataplexy: Secondary | ICD-10-CM

## 2013-06-07 NOTE — Sleep Study (Signed)
See media tab for full report  

## 2013-06-07 NOTE — Sleep Study (Signed)
MSLT Nap Questionnaire  PRE NAP SLEEPINESS SCALE 1-7  1 - Feeling Active and vital, alert, wide awake  2 - Functioning at a high level, but not at peak, able to concentrate  3 - Relaxed and awake, not at full alertness, responsive  4- A little foggy, let down  5 - Fogginess, beginning to lose interest in remaining awake  6 - Sleepiness, prefer to be lying down, fighting sleep, woozy  7 - Almost in reverie, sleep onset soon, losing struggle to remain awake  =============================================================== Nap #1 Pre nap level of sleepiness:  6 Lights out time:   Comments:  Post Nap Questions for nap #1:   Did you sleep?  YES  Did you dream?  YES  ================================================================ Nap #2 Pre nap level of sleepiness:  5 Lights out time:  Comments:  Post Nap Questions for nap #2:  Did you sleep? I THINK SO  Did you dream? I THINK SO ================================================================ Nap #3 Pre nap level of sleepiness:  4 Lights out time:  Comments:  Post Nap Questions for Nap #3:  Did you sleep?  YES  Did you dream?  YES  =============================================================== Nap #4 Pre nap level of sleepiness:  4 Lights out time:  Comments:  PATIENT WAS DOZING BETWEEN NAPS 3 AND 4, HAD TO BE ENCOURAGED TO GET UP AND OUT OF BED.  Post Nap Questions for Nap #4:  Did you sleep?  I DON'T THINK SO.  Did you dream?  NO.  =============================================================== Nap #5  - DEFERRED DUE TO TIME CONSTRAINTS, LAST NAP WOULDN'T START UNTIL 5:10 PM, POSSIBLE END TIME OF NEARLY 6:00 PM.  NO CLINICAL VALUE FOR NARCLEPSY DIAGNOSIS TO CONTINUE WITH 5TH NAP ATTEMPT PER MSLT PRACTICE PARAMETERS. Pre nap level of sleepiness:   Lights out time:  Comments:  Post Nap Questions for Nap #5:  Did you sleep?  Did you dream?  ===============================================================

## 2013-06-09 ENCOUNTER — Telehealth: Payer: Self-pay | Admitting: Neurology

## 2013-06-09 ENCOUNTER — Encounter: Payer: Self-pay | Admitting: *Deleted

## 2013-06-09 NOTE — Telephone Encounter (Signed)
I called and spoke with the patient about her recent sleep study. I informed the patient that the MSLT study was abnormal and was consistent with hypersomnolence. Also Dr. Vickey Hugerohmeier stated it may still be consistenet with a diagnosis of narcolepsy and she would like to discuss treatment options with her. Patient has agreed upon the date of April 17,2015 at 2:00 with arrival time of 1:45. I will fax a copy of the report to Dr. Ellis Parentsaub's office and mail a copy to the patient. Patient stated she only wanted the results going to Dr. Ellis Parentsaub's office and not Dr. Nolon Nationsalldorf's office.

## 2013-07-01 ENCOUNTER — Ambulatory Visit (INDEPENDENT_AMBULATORY_CARE_PROVIDER_SITE_OTHER): Payer: BC Managed Care – PPO | Admitting: Neurology

## 2013-07-01 ENCOUNTER — Encounter (INDEPENDENT_AMBULATORY_CARE_PROVIDER_SITE_OTHER): Payer: Self-pay

## 2013-07-01 ENCOUNTER — Encounter: Payer: Self-pay | Admitting: Neurology

## 2013-07-01 VITALS — BP 102/68 | HR 77 | Resp 18 | Ht 63.5 in | Wt 122.0 lb

## 2013-07-01 DIAGNOSIS — G4710 Hypersomnia, unspecified: Secondary | ICD-10-CM

## 2013-07-01 DIAGNOSIS — F900 Attention-deficit hyperactivity disorder, predominantly inattentive type: Secondary | ICD-10-CM

## 2013-07-01 DIAGNOSIS — F988 Other specified behavioral and emotional disorders with onset usually occurring in childhood and adolescence: Secondary | ICD-10-CM

## 2013-07-01 MED ORDER — METHYLPHENIDATE HCL 10 MG PO TABS: 10.0000 mg | ORAL_TABLET | Freq: Two times a day (BID) | ORAL | Status: AC

## 2013-07-01 NOTE — Patient Instructions (Signed)
Methylphenidate tablets What is this medicine? METHYLPHENIDATE (meth il FEN i date) is used to treat attention-deficit hyperactivity disorder (ADHD). It is also used to treat narcolepsy. This medicine may be used for other purposes; ask your health care provider or pharmacist if you have questions. COMMON BRAND NAME(S): Methylin, Ritalin What should I tell my health care provider before I take this medicine? They need to know if you have any of these conditions: -anxiety or panic attacks -circulation problems in fingers and toes -glaucoma -hardening or blockages of the arteries or heart blood vessels -heart disease or a heart defect -high blood pressure -history of a drug or alcohol abuse problem -history of stroke -liver disease -mental illness -motor tics, family history or diagnosis of Tourette's syndrome -seizures -suicidal thoughts, plans, or attempt; a previous suicide attempt by you or a family member -thyroid disease -an unusual or allergic reaction to methylphenidate, other medicines, foods, dyes, or preservatives -pregnant or trying to get pregnant -breast-feeding How should I use this medicine? Take this medicine by mouth with a glass of water. Follow the directions on the prescription label. It is best to take this medicine 30 to 45 minutes before meals, unless your doctor tells you otherwise. Take your medicine at regular intervals. Usually the last dose of the day will be taken at least 4 to 6 hours before bedtime, so it will not interfere with sleep. Do not take your medicine more often than directed. A special MedGuide will be given to you by the pharmacist with each prescription and refill. Be sure to read this information carefully each time. Talk to your pediatrician regarding the use of this medicine in children. While this drug may be prescribed for children as young as 6 years of age for selected conditions, precautions do apply. Overdosage: If you think you have  taken too much of this medicine contact a poison control center or emergency room at once. NOTE: This medicine is only for you. Do not share this medicine with others. What if I miss a dose? If you miss a dose, take it as soon as you can. If it is almost time for your next dose, take only that dose. Do not take double or extra doses. What may interact with this medicine? Do not take this medicine with any of the following medications: -lithium -MAOIs like Carbex, Eldepryl, Marplan, Nardil, and Parnate -other stimulant medicines for attention disorders, weight loss, or to stay awake -procarbazine This medicine may also interact with the following medications: -atomoxetine -caffeine -certain medicines for blood pressure, heart disease, irregular heart beat -certain medicines for depression, anxiety, or psychotic disturbances -certain medicines for seizures like carbamazepine, phenobarbital, phenytoin -cold or allergy medicines -warfarin This list may not describe all possible interactions. Give your health care provider a list of all the medicines, herbs, non-prescription drugs, or dietary supplements you use. Also tell them if you smoke, drink alcohol, or use illegal drugs. Some items may interact with your medicine. What should I watch for while using this medicine? Visit your doctor or health care professional for regular checks on your progress. This prescription requires that you follow special procedures with your doctor and pharmacy. You will need to have a new written prescription from your doctor or health care professional every time you need a refill. This medicine may affect your concentration, or hide signs of tiredness. Until you know how this drug affects you, do not drive, ride a bicycle, use machinery, or do anything that needs mental alertness.   Tell your doctor or health care professional if this medicine loses its effects, or if you feel you need to take more than the  prescribed amount. Do not change the dosage without talking to your doctor or health care professional. For males, contact your doctor or health care professional right away if you have an erection that lasts longer than 4 hours or if it becomes painful. This may be a sign of a serious problem and must be treated right away to prevent permanent damage. Decreased appetite is a common side effect when starting this medicine. Eating small, frequent meals or snacks can help. Talk to your doctor if you continue to have poor eating habits. Height and weight growth of a child taking this medicine will be monitored closely. Do not take this medicine close to bedtime. It may prevent you from sleeping. If you are going to need surgery, a MRI, CT scan, or other procedure, tell your doctor that you are taking this medicine. You may need to stop taking this medicine before the procedure. Tell your doctor or healthcare professional right away if you notice unexplained wounds on your fingers and toes while taking this medicine. You should also tell your healthcare provider if you experience numbness or pain, changes in the skin color, or sensitivity to temperature in your fingers or toes. What side effects may I notice from receiving this medicine? Side effects that you should report to your doctor or health care professional as soon as possible: -allergic reactions like skin rash, itching or hives, swelling of the face, lips, or tongue -changes in vision -chest pain or chest tightness -fast, irregular heartbeat -fingers or toes feel numb, cool, painful -hallucination, loss of contact with reality -high blood pressure -males: prolonged or painful erection -seizures -severe headaches -shortness of breath -suicidal thoughts or other mood changes -trouble walking, dizziness, loss of balance or coordination -uncontrollable head, mouth, neck, arm, or leg movements -unusual bleeding or bruising Side effects that  usually do not require medical attention (report to your doctor or health care professional if they continue or are bothersome): -anxious -headache -loss of appetite -nausea, vomiting -trouble sleeping -weight loss This list may not describe all possible side effects. Call your doctor for medical advice about side effects. You may report side effects to FDA at 1-800-FDA-1088. Where should I keep my medicine? Keep out of the reach of children. This medicine can be abused. Keep your medicine in a safe place to protect it from theft. Do not share this medicine with anyone. Selling or giving away this medicine is dangerous and against the law. Store at room temperature between 15 and 30 degrees C (59 and 86 degrees F). Protect from light and moisture. Keep container tightly closed. Throw away any unused medicine after the expiration date. NOTE: This sheet is a summary. It may not cover all possible information. If you have questions about this medicine, talk to your doctor, pharmacist, or health care provider.  2014, Elsevier/Gold Standard. (2012-11-22 10:09:08)

## 2013-07-01 NOTE — Progress Notes (Signed)
[facility]  Provider:  Larey Mcdowell, [place] D  Referring Provider: Darlyne Russian, MD Primary Care Physician:  Kristi Reichmann, MD  Hypersomnia concern.   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    Kristi Mcdowell is here today with her mother, Kristi Mcdowell. Her PSG was normal ( AHI 1.4) and documented a high sleep efficiency and no insomnia. Her MSLT documented a mean sleep latency of 11 minutes one SREM at 11 minutes. Her fatigue degree remains high, her Epworth is only 8 points, FSS is 50.  HLA was negative. I recommended to try ritalin. No sleep aid needed.     Last visit note :  Dear Kristi Mcdowell,  Thank you very much for referring your patient Kristi Mcdowell to my care ;   As you know , Kristi Mcdowell is  a 33 year old right-handed, Caucasian female patient, who had been seen recently by you.  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  She has by now been chronically fatigued for over 18 month . The hypersomnia symptom episode begun along  waxing and waning symptoms of malaise and fatigue, the problem has been intermittently occurring but was  Never " gone " .   The patient insists that her co-existent depression did not to correlate with the degree of sleepiness or fatigue she experiences. She has not gained or lost a dramatic amount of weight in the last 3 years at any given time, was not tearful or agitated to a degree that suggests an exacerbation of a mood disorder.    She  reports the following sleep habits;  Kristi Mcdowell attempts to go to bed regularly at about 11 PM, yet it will  take her between 30-60 minutes to fall asleep. She describes her bedroom at cool, quiet and  dark and she does not watch TV nor reads when in bed.  Occasionally, she will feel fatigued as early as 8 or 9 PM- she will go earlier to bed and fall asleep promptly, but than  experiences very vivid dreams . She has made a conscious effort to establish a routine bedtime at about 11 PM over the last 2 month. She will now sleep through the night, will not wake up, reports no fragmentation of  sleep. She has to rise in the morning at about 7:30 and she relies on  multiple alarms at multiple times . She is difficult to arouse, but has been since  childhood  .  She " never" feels that she spontaneously could be ready to get up and go. At this time, she drinks 1-2 cups of coffee in AM , and one in daytime, but none after lunch . Coffee seems to give her the desired little extra kick.  If there is no alarm clock set, she may sleep up to 11 or 12 noon, but rising late, she is not feeling refreshed or restored. She may feel worse and have a headache. She shares the bedroom with her boyfriend. A dull headache was noted during the day- increasing as the day goes by.  It doesn't wake her from sleep and doesn't seem to be regularly present as she wakens  up in the morning. Her headache is unrelated to any activity and is not exacerbated by activity, is not better when laying down or standing up.She naps whenever there is an opportunity and her naps feel refreshing, even if as brief as  20-30 minutes in duration . She craves napping longer. She has reported frequent  dreams , vivid dreams occurring early in a nap or early onset during night time sleep. She had experienced sleep paralysis, dream intrusion and cataplexy.    PS: There is no photophobia, phonophobia, nausea or vomiting related to the HA reported . In addition, the patient reports occasional spells of fevers and elevated temperature, but she has not measured her temperature objectively. This seems to resolve on its own after several hours,  Sometimes after   has she taken a Tylenol , and she's not sure if Tylenol has any true effect on resolving the symptom of " fever "any quicker.   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  Urine tests performed on 04/27/2013 were negative, CBC performed on 03-21-13 documented  a normal white blood cell count, red blood cell count, hemoglobin and hematocrit, differential blood count did not give any evidence of a acute infection of  bacterial, fungal, allergic or viral factors.  The urine analysis was again in  normal range. Her complete metabolic profile was entirely normal,  as well TSH, T4 - her sedimentation rate is normal , her C-reactive protein is low.  Her repeat viral capsule antigen testing was positive for IgG and IgM, levels  were rising  - reflected in the EBV Epstein-Barr virus VCA Ig units over the last year from 481 to 607.    Ultrasound from 7-5 2013 and the follow up clinic visit in August with Kristi Mcdowell revealed that the patient has splenomegaly. She has no family history of a sleep disorder, works as a Education officer, museum, both parents are physicians , Kristi Mcdowell and Kristi Mcdowell.       Review of Systems: Out of a complete 14 system review, the patient complains of only the following symptoms, and all other reviewed systems are negative.  The patient reports falling asleep and going into dream sleep very quickly , often vivid dreams she can recall. She reports that after being startled or emotionally upset she will experience brief periods of muscle tone loss, she has had several episodes of sleep paralysis. She denies having any daytime attacks, she naps when she can , but not having  the irresistible urge  to fall asleep.   Her fatigue severity score was endorsed at 50 points and her Epworth sleepiness score is endorsed at 8 points  today. In her review of systems she endorsed decreased energy and the feeling that she actually sleeps too much  in relation to the sleep time . Sleepiness level  is neither affected by duration of sleep , and described as not  restorative. She also has endorsed fevers and headaches and some submandibular lymph node swelling.  History   Social History  . Marital Status: Single    Spouse Name: N/A    Number of Children: 0  . Years of Education: Masters   Occupational History  .  Social Work    Social History Main Topics  . Smoking status: Never Smoker   . Smokeless tobacco: Never Used  . Alcohol Use: Yes     Comment: 3 drinks  . Drug Use: No  . Sexual Activity: Yes   Other Topics Concern  . Not on file   Social History Narrative   Patient is single and lives with roommates.   Patient is working full-time.   Patient has a Scientist, water quality.   Patient is right-handed.   Patient drinks 1-2 cups of coffee daily   Significant other. Exercise: Yes.    Family History  Problem Relation Age of Onset  . Cancer Father   . Heart disease Maternal Grandfather   . Hyperlipidemia Maternal Grandfather   . Hypertension Maternal Grandfather   . Stroke Maternal Grandfather     Past Medical History  Diagnosis Date  . Anxiety   . Depression   . CFS (chronic fatigue syndrome)   . Hypersomnia, persistent 05/05/2013  . Chronic fatigue disorder 05/05/2013    History reviewed. No pertinent past surgical history.  Current Outpatient Prescriptions  Medication Sig Dispense Refill  . Omega-3 Fatty Acids (FISH OIL) 1000 MG CPDR Take by mouth.      Marland Kitchen OVER THE COUNTER MEDICATION Vitamins D 3, C, B 9      . OVER THE COUNTER MEDICATION Fish oil taking occas       No current facility-administered medications for this visit.    Allergies as of 07/01/2013  . (No Known  Allergies)    Vitals: BP 102/68  Pulse 77  Resp 18  Ht 5' 3.5" (1.613 m)  Wt 122 lb (55.339 kg)  BMI 21.27 kg/m2  LMP 06/10/2013 Last Weight:  Wt Readings from Last 1 Encounters:  07/01/13 122 lb (55.339 kg)   Last Height:   Ht Readings from Last 1 Encounters:  07/01/13 5' 3.5" (1.613 m)    Physical exam: Physical Exam  Head: Normocephalic and atraumatic.  Right Ear: External ear normal.  Left Ear: External ear normal.  Eyes: Pupils are equal, round, and reactive to light. Right eye exhibits no discharge. Left eye exhibits no discharge. No scleral icterus.  Neck: Normal range of motion. Neck supple. No JVD present. No tracheal deviation present. No thyromegaly present.  Bean sized right posterior cervical lymph node   General: The patient is awake, alert and appears not in acute distress. The patient is well groomed. Head: Normocephalic, atraumatic. Neck is supple. Mallampati 1 , neck circumference: 14  Cardiovascular:  Regular rate and rhythm , without  murmurs or carotid bruit, and without distended neck veins. Respiratory: Lungs are clear to auscultation. Skin:  Without evidence of edema, or rash Trunk: BMI is normal .  The patient has normal posture.  Neurologic exam : The patient is awake and alert, oriented to place and time.  Memory subjective described as intact.  There is a normal attention span & concentration ability. Speech is fluent without  dysarthria, dysphonia or aphasia.  Mood and affect are appropriate. The patient was neither depressed nor elated .   Cranial nerves: Pupils are equal and briskly reactive to light.  Hearing to finger rub intact.  Facial sensation intact to fine touch. Facial motor strength is symmetric and tongue and uvula move midline.  Motor exam:   Normal tone, muscle bulk and symmetric strength in all extremities.  Sensory:  Fine touch, pinprick and vibration were tested in all extremities. Proprioception was  normal.  Coordination: Rapid alternating movements in the fingers/hands is normal. Finger-to-nose maneuvernormal without evidence of ataxia, dysmetria or tremor.  Gait and station: Patient walks without assistive device , Deep tendon reflexes: in the  upper and lower extremities are symmetric and intact. Babinski maneuver response is  down going.   Assessment:  After physical and neurologic examination, review of laboratory studies, imaging, neurophysiology testing and pre-existing records, assessment is :  #1 the patient's appears to be chronically fatigued. PSG and MSLT, HLA were negative.   Plan:  Treatment plan and additional workup :  Trial of Ritalin.  Rv in 3-4 month prn.   Kristi Seat, MD

## 2013-12-06 ENCOUNTER — Ambulatory Visit: Payer: BC Managed Care – PPO | Admitting: Neurology

## 2013-12-16 ENCOUNTER — Other Ambulatory Visit: Payer: Self-pay | Admitting: Emergency Medicine

## 2013-12-16 ENCOUNTER — Telehealth: Payer: Self-pay | Admitting: Emergency Medicine

## 2013-12-16 MED ORDER — ALPRAZOLAM 0.25 MG PO TABS: 0.2500 mg | ORAL_TABLET | Freq: Two times a day (BID) | ORAL | Status: DC | PRN

## 2013-12-16 NOTE — Telephone Encounter (Signed)
Patient called requesting a refill of her Xanax. She apparently quit her job because she was not happy with that. Otherwise she is doing okay. She has not been suicidal. She takes about one pill a day of the Xanax 0.25. The prescription I gave her last December has lasted until now. I did refill her medication and should make an appointment to see me to discuss her situation

## 2014-01-19 ENCOUNTER — Ambulatory Visit (INDEPENDENT_AMBULATORY_CARE_PROVIDER_SITE_OTHER): Payer: BC Managed Care – PPO | Admitting: Emergency Medicine

## 2014-01-19 ENCOUNTER — Encounter: Payer: Self-pay | Admitting: Emergency Medicine

## 2014-01-19 VITALS — BP 102/80 | HR 90 | Temp 99.0°F | Resp 16 | Ht 63.25 in | Wt 119.2 lb

## 2014-01-19 DIAGNOSIS — R5382 Chronic fatigue, unspecified: Secondary | ICD-10-CM

## 2014-01-19 DIAGNOSIS — F329 Major depressive disorder, single episode, unspecified: Secondary | ICD-10-CM

## 2014-01-19 DIAGNOSIS — F32A Depression, unspecified: Secondary | ICD-10-CM

## 2014-01-19 DIAGNOSIS — R6889 Other general symptoms and signs: Secondary | ICD-10-CM

## 2014-01-19 DIAGNOSIS — R760 Raised antibody titer: Secondary | ICD-10-CM

## 2014-01-19 MED ORDER — BUPROPION HCL ER (XL) 150 MG PO TB24: 150.0000 mg | ORAL_TABLET | Freq: Every day | ORAL | Status: AC

## 2014-01-19 NOTE — Progress Notes (Addendum)
Subjective:  This chart was scribed for Collene GobbleSteven A Tinisha Etzkorn, MD by Charline BillsEssence Howell, ED Scribe. The patient was seen in room 22. Patient's care was started at 11:06 AM.   Patient ID: Kristi Mcdowell, female    DOB: 02/05/1981, [age] y.o.   MRN: [id number]  Chief Complaint  Patient presents with  . Follow-up    Fatigue   HPI HPI Comments: Kristi Mcdowell is a 33 y.o. female, with a h/o chronic fatigue syndrome, anxiety, depression, who presents to the [facility] for follow-up regarding fatigue. Last labs drawn in February 2015. Pt states "I have some energy but I'm just really tired all the time." Pt states that she had a sleep study done which showed abnormalities and hypersomnia. Pt was advised to begin Ritalin, however, she stopped taking it 2 days ago since it did not improve symptoms.   Sore Throat Pt reports that she has unchanged inflammation. She reports constant sore throat.   Depression Pt reports h/o low chronic depression. She attributes depression to being too tired to do anything. Pt has seen a psychologist that she likes but she has not seen her often. Pt is open to trying an antidepressant.   Preventative Maintenance Pt recently had pap smear done; normal. She reports condom use with her boyfriend.   Physical Activity  Pt states that she currently practices yoga x1 weekly and really enjoys it. She reports that she has been doing yoga since age 33. Pt's current weight is 119; she states that this is normal for her.   Work Today was her last day at work. 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 She states that she is glad that she quit although she loves counseling. 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 She reports an increase in anxiety that she attributes to work.   Immunizations Pt does not wish to have a flu vaccine.   Pt states that her boyfriend and her are doing well. He lives in [place] and is in school to obtain a social work degree. He plans to work with adult mental health and substance abuse. Pt reports that she moved back home but states that it is only temporary.   Past Medical History  Diagnosis Date  . Anxiety   . Depression   . CFS (chronic fatigue syndrome)   . Hypersomnia, persistent 05/05/2013  . Chronic fatigue disorder 05/05/2013   Current Outpatient Prescriptions on File Prior to Visit  Medication Sig Dispense Refill  . ALPRAZolam (XANAX) 0.25 MG tablet Take 1 tablet (0.25 mg total) by mouth 2 (two) times daily as needed for anxiety. 20 tablet 1  . methylphenidate (RITALIN) 10 MG tablet Take 1 tablet (10 mg total) by mouth 2 (two) times daily. 60 tablet 0  . OVER THE COUNTER MEDICATION Vitamins D 3, C, B 9    . Omega-3 Fatty Acids (FISH OIL) 1000 MG CPDR Take by mouth.    Marland Kitchen. OVER THE COUNTER MEDICATION Fish oil taking occas     No current facility-administered medications on file prior to visit.   No Known Allergies  Review of Systems  Constitutional: Positive for fatigue. Negative for fever, chills, activity change and unexpected weight change.  HENT: Positive for sore throat.   Eyes: Negative for visual disturbance.  Respiratory: Negative for chest tightness and shortness of breath.   Cardiovascular: Negative for chest pain.  Neurological: Negative for dizziness and light-headedness.  Psychiatric/Behavioral: The patient is nervous/anxious.       Objective:   Physical Exam CONSTITUTIONAL: Well developed/well nourished HEAD: Normocephalic/atraumatic EYES: EOMI/PERRL ENMT: Mucous membranes moist NECK: supple no meningeal signs, no adenopathy, no masses  SPINE:entire spine nontender CV: S1/S2 noted, no murmurs/rubs/gallops noted LUNGS: Lungs are clear to auscultation bilaterally, no apparent distress ABDOMEN: soft, nontender, no rebound or guarding GU:no cva tenderness NEURO: Pt is awake/alert, moves all extremitiesx4 EXTREMITIES: pulses  normal, full ROM SKIN: warm, color normal PSYCH: no abnormalities of mood noted    Assessment & Plan:  Pt suffers from anxiety and depression. Will give a trial of Wellbutrin. Reevaluate in 2 weeks by phone. She declines a flu shot. Labs done to follow-up on her EBV infection.patient had a ASQ 9 score of 10 which puts her mild to moderate depressed and a anxiety score of mild to moderate. By the [facility] assessment scale with her anxiety index of 51  I personally performed the services described in this documentation, which was scribed in my presence. The recorded information has been reviewed and is accurate.

## 2014-01-20 LAB — CBC WITH DIFFERENTIAL/PLATELET
BASOS ABS: 0 10*3/uL (ref 0.0–0.1)
Basophils Relative: 0 % (ref 0–1)
Eosinophils Absolute: 0.1 10*3/uL (ref 0.0–0.7)
Eosinophils Relative: 2 % (ref 0–5)
HCT: 40.8 % (ref 36.0–46.0)
Hemoglobin: 14.2 g/dL (ref 12.0–15.0)
Lymphocytes Relative: 31 % (ref 12–46)
Lymphs Abs: 1.5 10*3/uL (ref 0.7–4.0)
MCH: 31.4 pg (ref 26.0–34.0)
MCHC: 34.8 g/dL (ref 30.0–36.0)
MCV: 90.3 fL (ref 78.0–100.0)
Monocytes Absolute: 0.4 10*3/uL (ref 0.1–1.0)
Monocytes Relative: 8 % (ref 3–12)
Neutro Abs: 2.9 10*3/uL (ref 1.7–7.7)
Neutrophils Relative %: 59 % (ref 43–77)
Platelets: 219 10*3/uL (ref 150–400)
RBC: 4.52 MIL/uL (ref 3.87–5.11)
RDW: 13 % (ref 11.5–15.5)
WBC: 4.9 10*3/uL (ref 4.0–10.5)

## 2014-01-20 LAB — T4, FREE: Free T4: 1.26 ng/dL (ref 0.80–1.80)

## 2014-01-20 LAB — EPSTEIN-BARR VIRUS VCA ANTIBODY PANEL
EBV EA IgG: 14.6 U/mL — ABNORMAL HIGH (ref <9.0)
EBV NA IgG: >600 U/mL — ABNORMAL HIGH (ref <18.0)
EBV VCA IgG: 660 U/mL — ABNORMAL HIGH (ref <18.0)

## 2014-01-20 LAB — TSH: TSH: 1.754 u[IU]/mL (ref 0.350–4.500)

## 2014-01-20 LAB — COMPLETE METABOLIC PANEL WITH GFR
ALT: <8 U/L (ref 0–35)
AST: 15 U/L (ref 0–37)
Albumin: 4.7 g/dL (ref 3.5–5.2)
Alkaline Phosphatase: 37 U/L — ABNORMAL LOW (ref 39–117)
BUN: 13 mg/dL (ref 6–23)
CO2: 26 mEq/L (ref 19–32)
CREATININE: 0.71 mg/dL (ref 0.50–1.10)
Calcium: 9.3 mg/dL (ref 8.4–10.5)
Chloride: 98 mEq/L (ref 96–112)
GFR, Est African American: >89 mL/min
GFR, Est Non African American: >89 mL/min
Glucose, Bld: 81 mg/dL (ref 70–99)
Potassium: 4.2 mEq/L (ref 3.5–5.3)
Sodium: 136 mEq/L (ref 135–145)
Total Bilirubin: 0.7 mg/dL (ref 0.2–1.2)
Total Protein: 7.2 g/dL (ref 6.0–8.3)

## 2014-01-20 LAB — SEDIMENTATION RATE: Sed Rate: 1 mm/hr (ref 0–22)

## 2014-06-01 IMAGING — US US ABDOMEN COMPLETE
1 series · 13 of 25 positions shown · non-contrast
Comparison: Ultrasound 09/19/2011

CLINICAL DATA: Splenomegaly

EXAM:
ULTRASOUND ABDOMEN COMPLETE

[Series 1: us abdomen complete · 0.24mm/px · 13 of 96 slices shown]
[im 1/96]
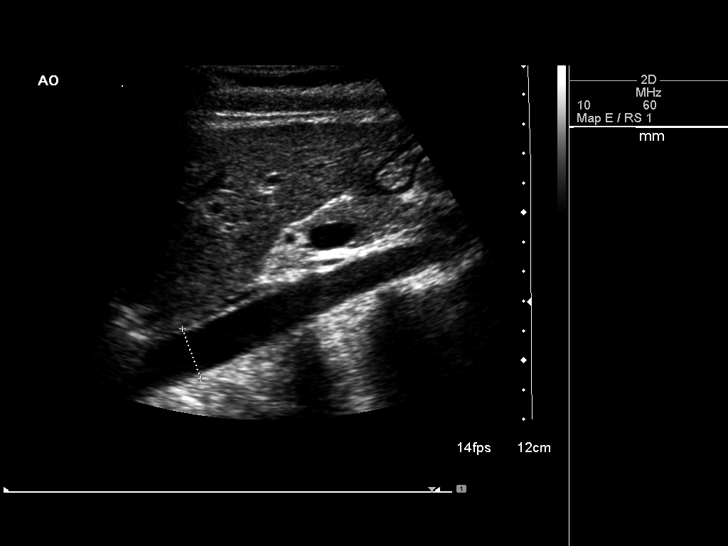
[im 8/96]
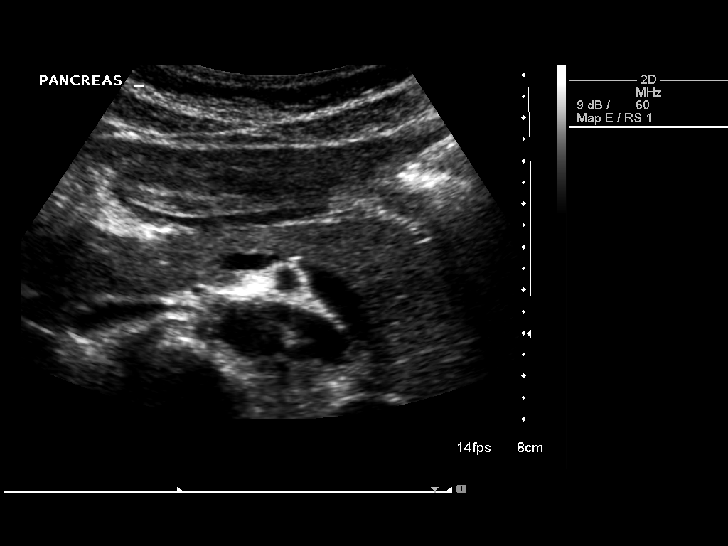
[im 16/96]
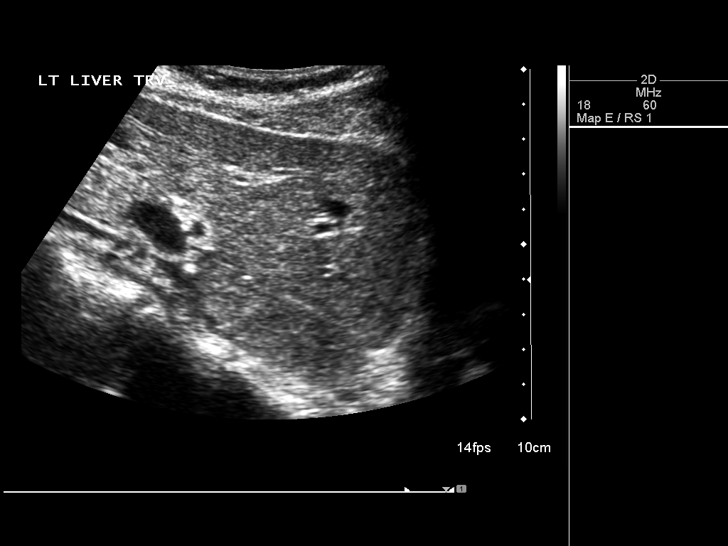
[im 24/96]
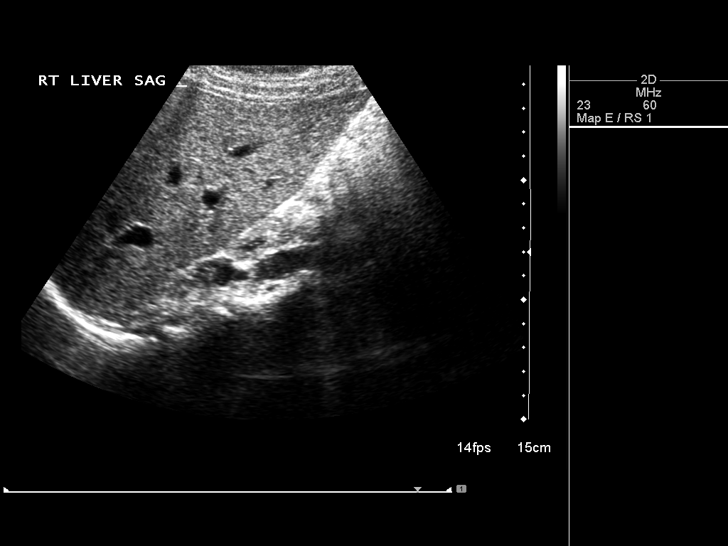
[im 32/96]
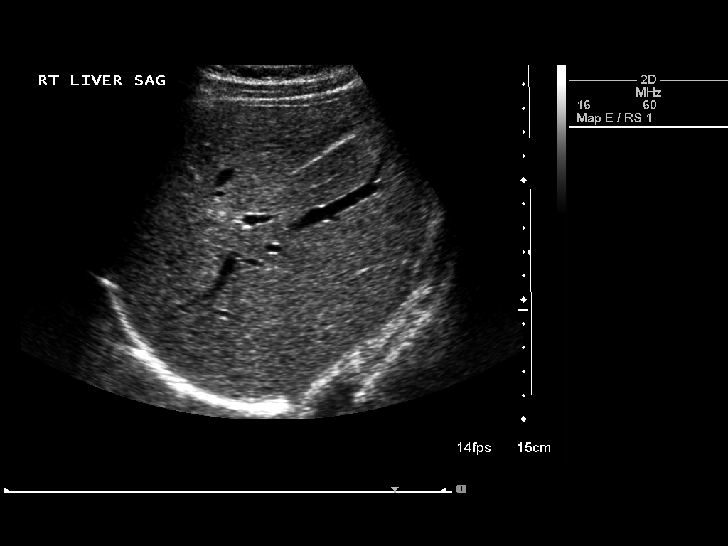
[im 40/96]
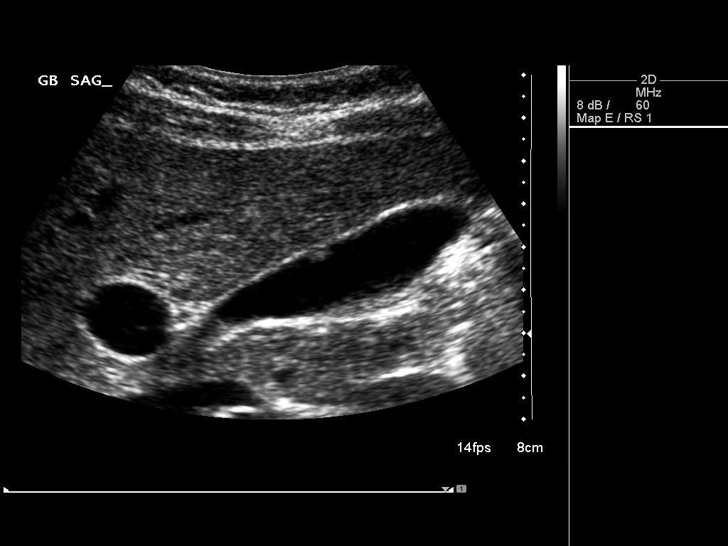
[im 48/96]
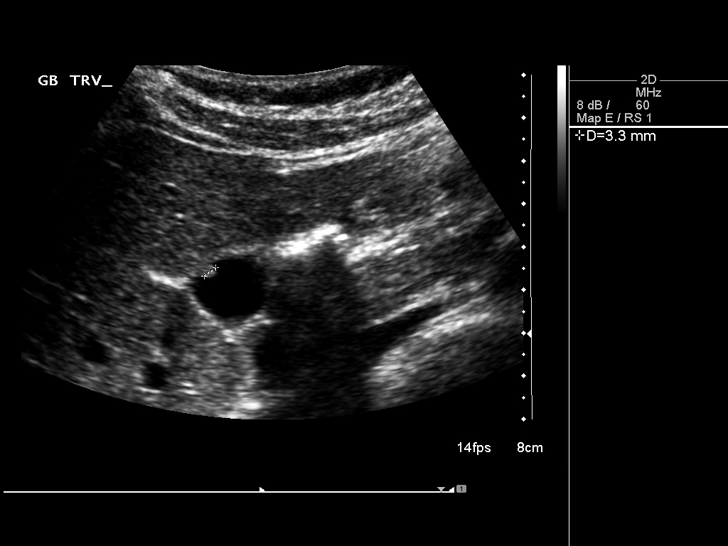
[im 56/96]
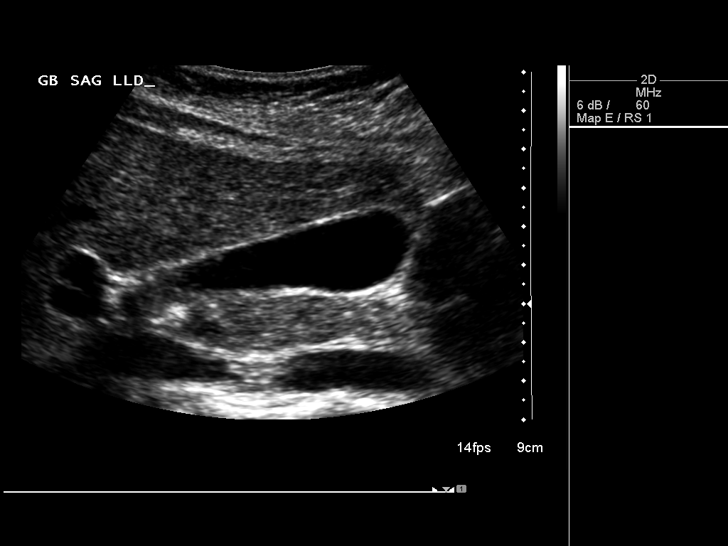
[im 64/96]
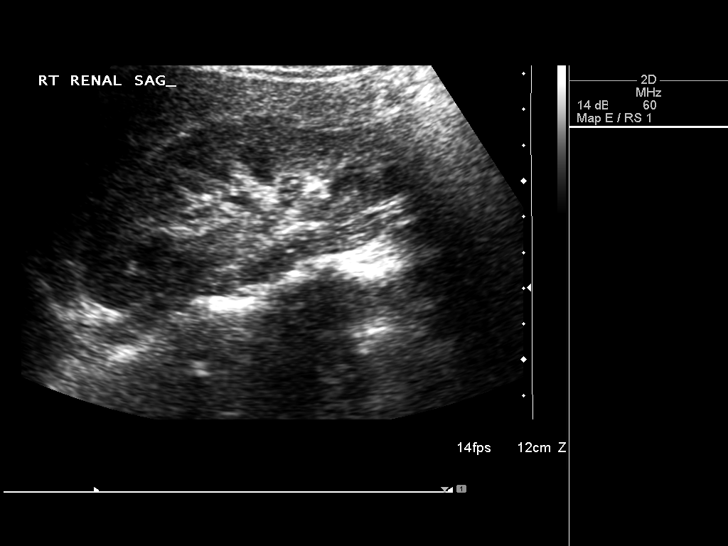
[im 72/96]
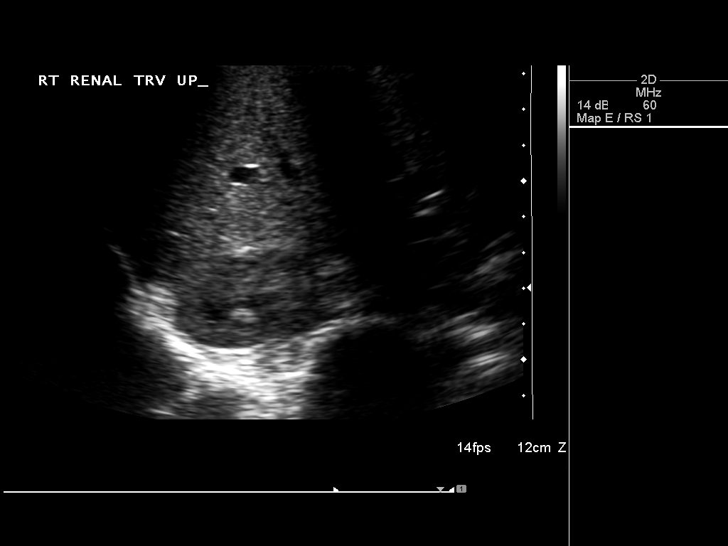
[im 80/96]
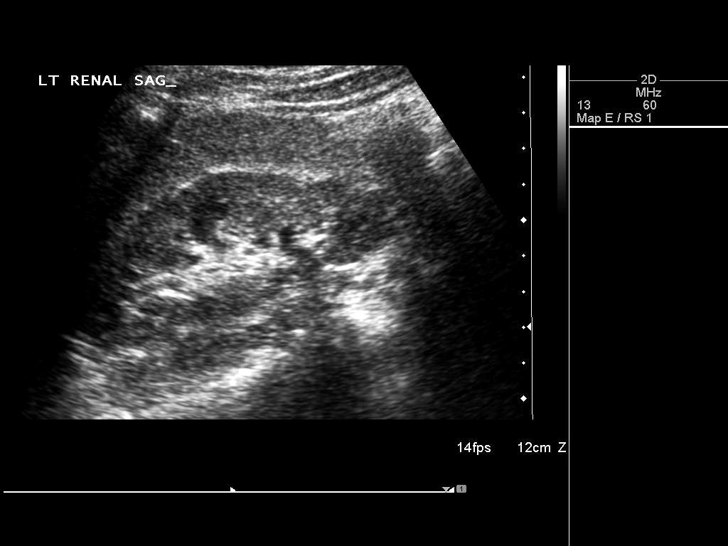
[im 88/96]
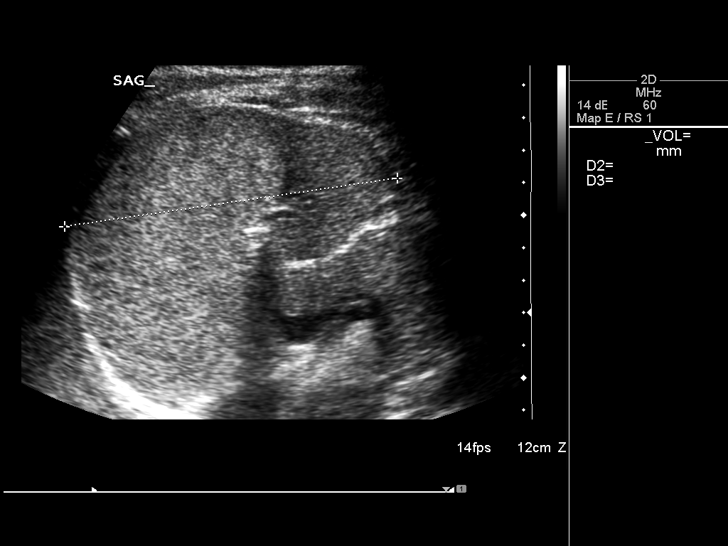
[im 96/96]
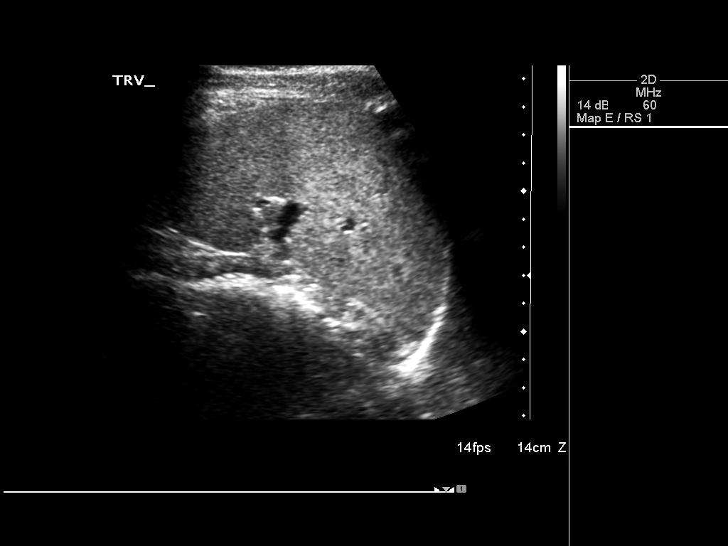

[13 of 25 positions shown; findings below may reference images not displayed]

FINDINGS: Gallbladder:

Nonshadowing foci along the nondependent wall bladder do not move
than may represent polyps. This is unchanged from the prior study.
No definite gallstone or gallbladder wall thickening. Negative
sonographic Murphy sign

Common bile duct:

Diameter: 4.0 mm

Liver:

No focal lesion identified. Within normal limits in parenchymal
echogenicity.

IVC:

No abnormality visualized.

Pancreas:

Visualized portion unremarkable.

Spleen:

Splenic length is 10.3 cm. Splenic volume 408 mL, slightly improved
from the prior study but enlarged.

Right Kidney:

Length: 10.8 cm in length.. 5 mm non shadowing echogenic focus in
the lower pole may represent a small stone or angiomyolipoma
containing fat. This is unchanged from the prior study.

Left Kidney:

Length: 10.0 cm in length. Echogenicity within normal limits. No
mass or hydronephrosis visualized.

Abdominal aorta:

No aneurysm visualized.

Other findings:

None.
IMPRESSION: Splenomegaly. Splenic volume 408 mL, slightly improved from the
prior study.

Probable gallbladder polyps unchanged

5 mm echogenic focus right lower pole unchanged.

## 2014-12-19 ENCOUNTER — Encounter: Payer: Self-pay | Admitting: Emergency Medicine

## 2015-03-17 ENCOUNTER — Other Ambulatory Visit: Payer: Self-pay | Admitting: Emergency Medicine

## 2015-03-17 MED ORDER — ALPRAZOLAM 0.25 MG PO TABS: 0.2500 mg | ORAL_TABLET | Freq: Two times a day (BID) | ORAL | Status: AC | PRN
